# Patient Record
Sex: Male | Born: 1985 | Race: Black or African American | Hispanic: No | Marital: Single | Smoking: Light tobacco smoker
Health system: Southern US, Community
[De-identification: ages and names within clinical notes are randomized; demographics above are authoritative.]

---

## 2006-04-30 ENCOUNTER — Emergency Department (HOSPITAL_COMMUNITY): Admission: EM | Admit: 2006-04-30 | Discharge: 2006-04-30 | Payer: Self-pay | Admitting: Family Medicine

## 2008-07-11 IMAGING — CR DG ABDOMEN 1V
1 series · 1 of 1 positions shown · non-contrast
Comparison: none

CLINICAL DATA: Right flank pain, vomiting

ABDOMEN - 1 VIEW:

[view not recorded]
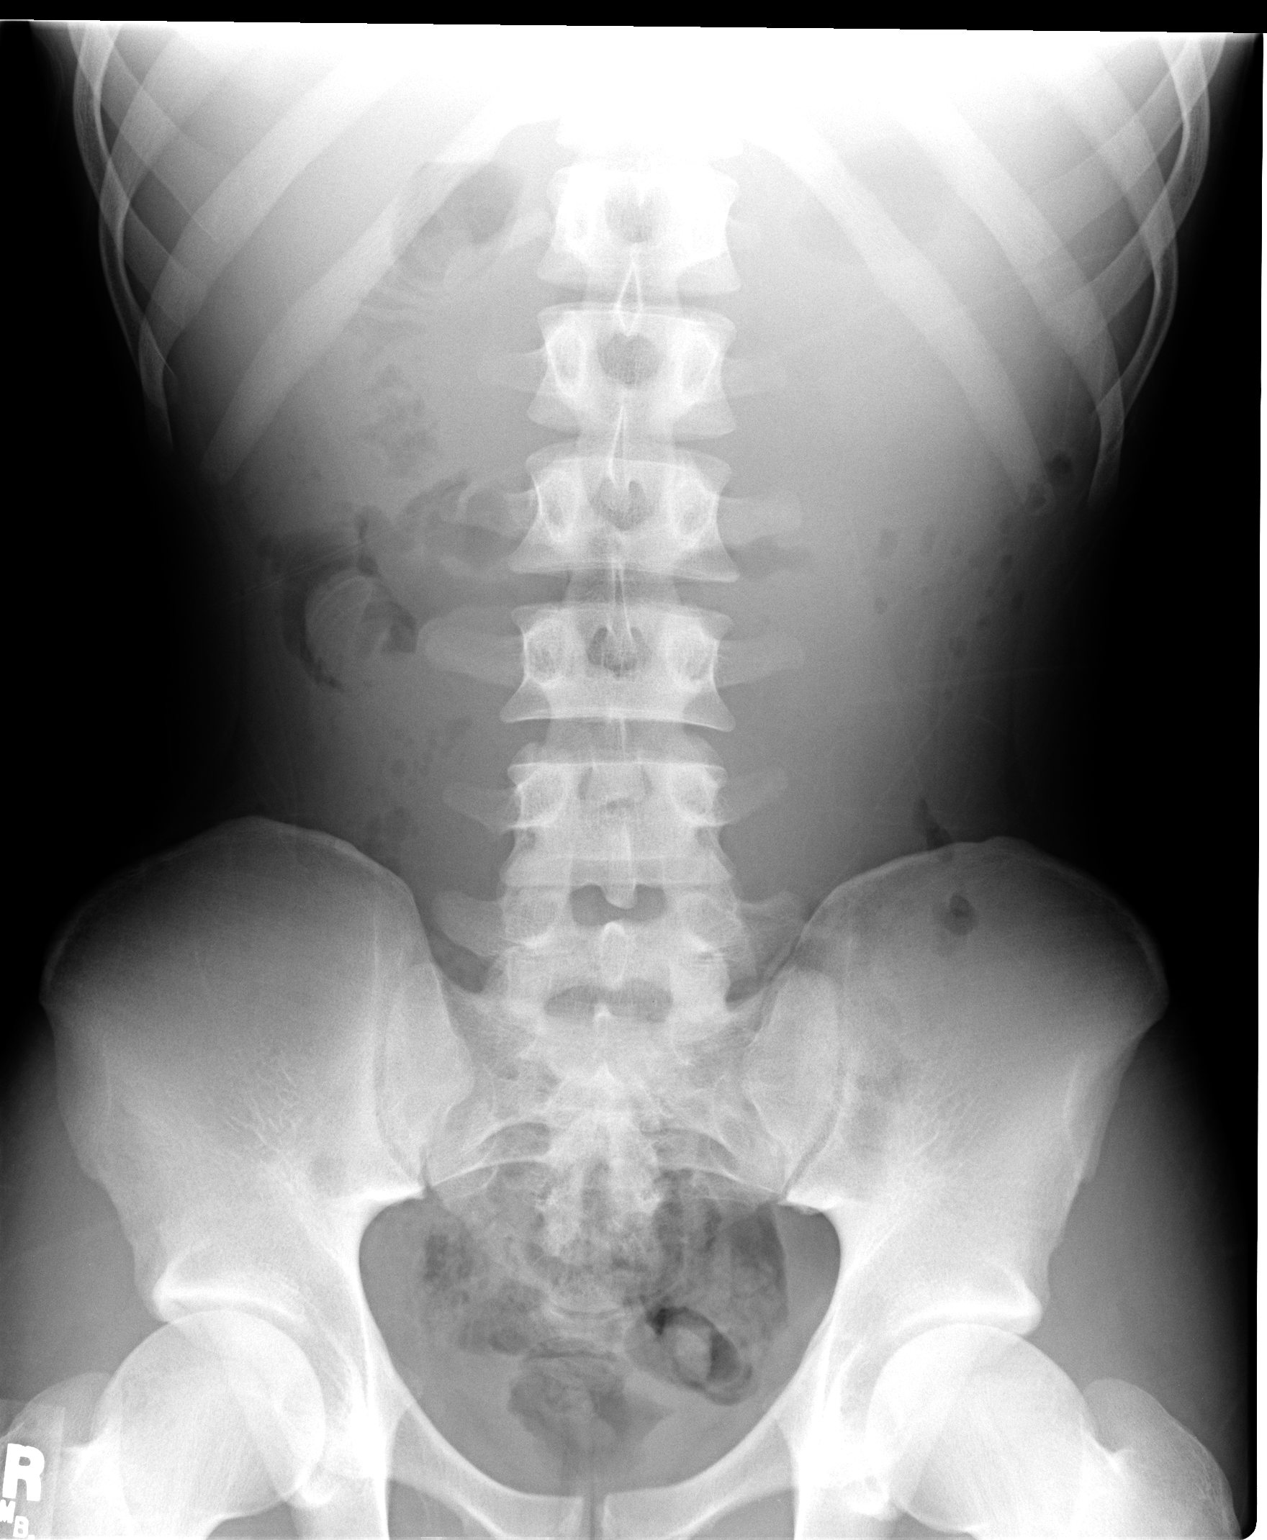

[1 of 1 positions shown; findings below may reference images not displayed]

FINDINGS: There is a mildly prominent single loop of small bowel within the
right midabdomen. There is an unusual appearance of this loop which may be
related to superimposition over colon, but I cannot completely exclude
intussusception given this appearance. If further evaluation is felt warranted,
this could be further evaluated with CT. The right psoas muscle margin is also
not well visualized.

No evidence of organomegaly or suspicious calcification. Visualized skeleton
unremarkable.
IMPRESSION: Mildly prominent small bowel loop in the right abdomen. Given the appearance
cannot completely exclude intussusception. This can be further evaluated with
CT.

## 2008-07-11 IMAGING — CT CT ABDOMEN W/ CM
2 of 5 series · 17 of 46 positions shown, 19 images · IV contrast (APPLIED)
Comparison: none

CLINICAL DATA: Right-sided abdomen pain, nausea and vomiting.  
 ABDOMEN CT WITH CONTRAST:
TECHNIQUE: Multidetector CT imaging of the abdomen was performed following the standard protocol during bolus administration of intravenous contrast.
 Contrast:  100 cc Omnipaque 300.
TECHNIQUE: Multidetector CT imaging of the pelvis was performed following the standard protocol during bolus administration of intravenous contrast.

[Series 2: abd/pelv with 5.0 b31f st · axial · 0.63mm/px · z∈[-496,-96]mm · 14 of 90 slices shown, 16 images]
[im 5/90  soft-tissue]
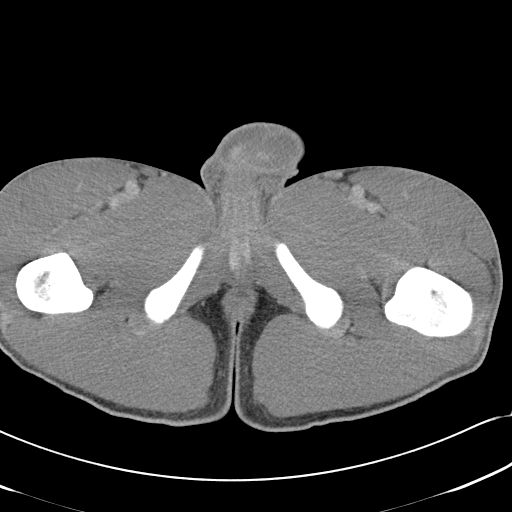
[im 5/90  bone]
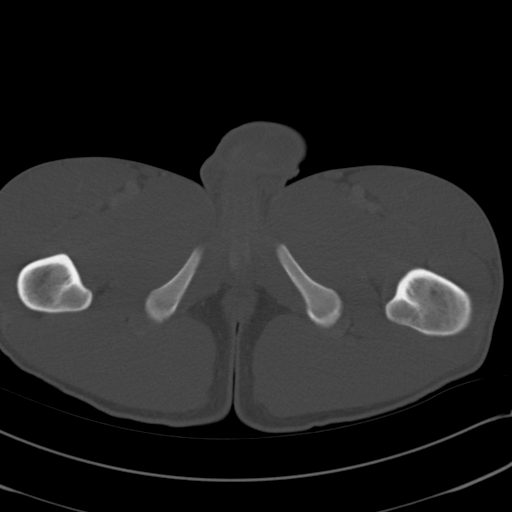
[im 10/90  soft-tissue]
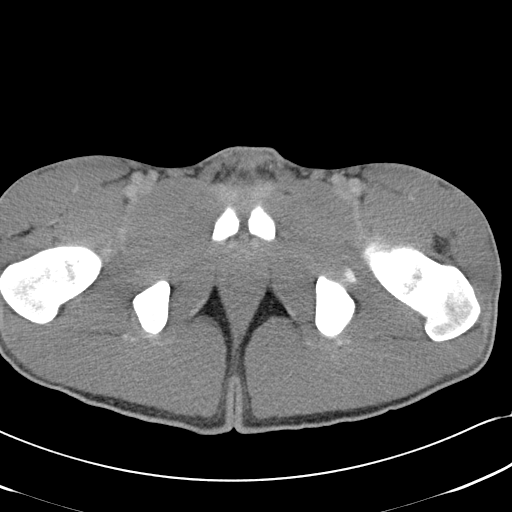
[im 19/90  soft-tissue]
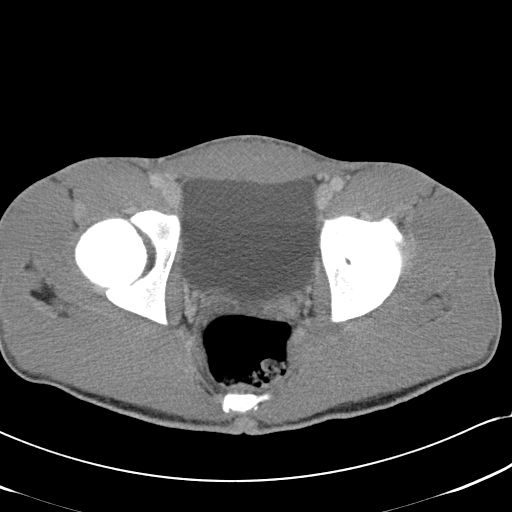
[im 24/90  soft-tissue]
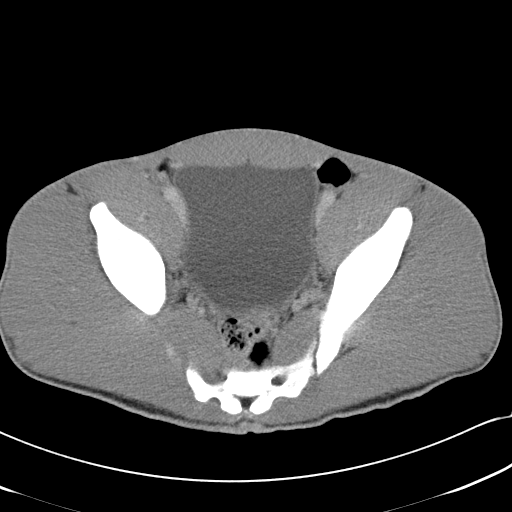
[im 29/90  soft-tissue]
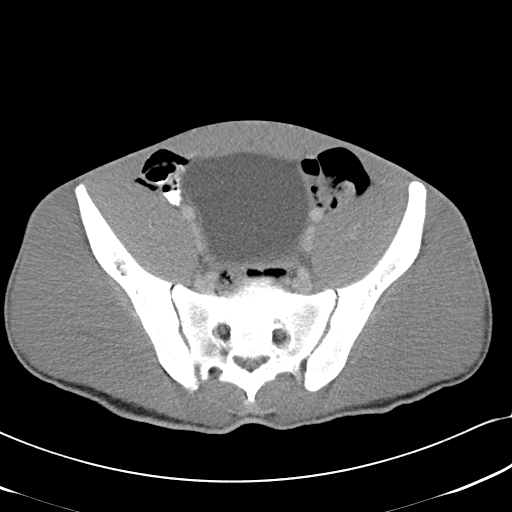
[im 38/90  soft-tissue]
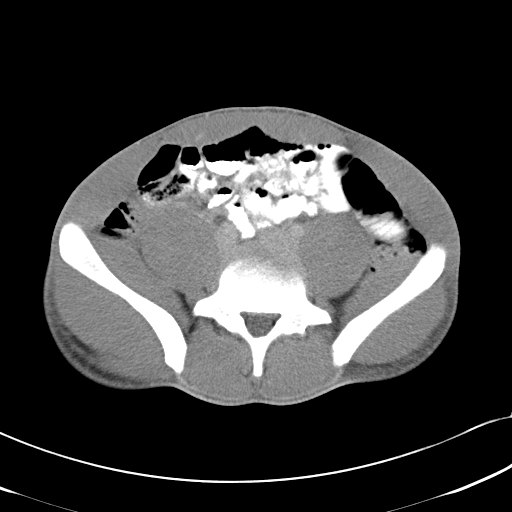
[im 43/90  soft-tissue]
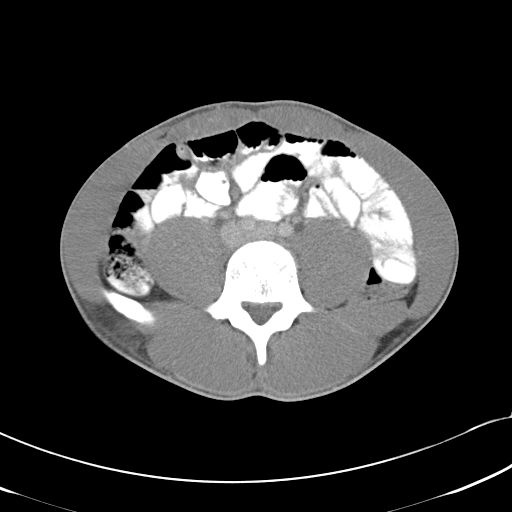
[im 47/90  soft-tissue]
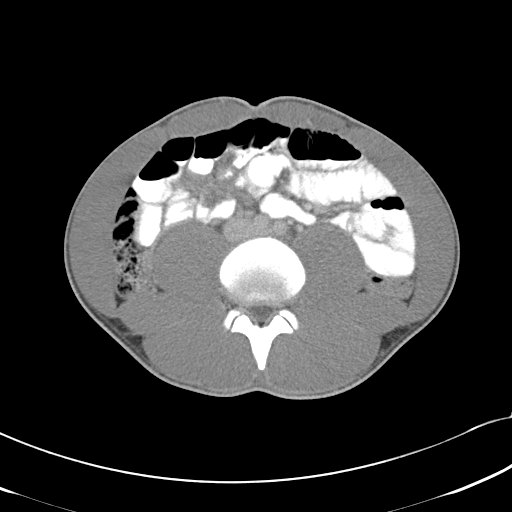
[im 52/90  soft-tissue]
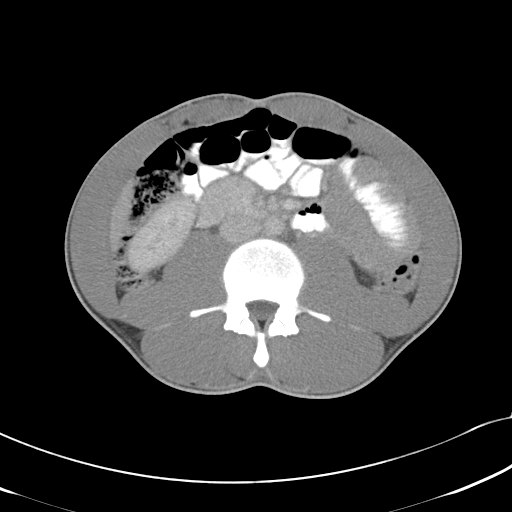
[im 52/90  bone]
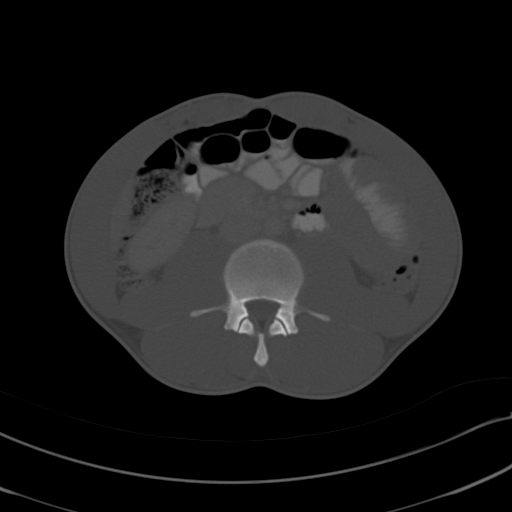
[im 61/90  soft-tissue]
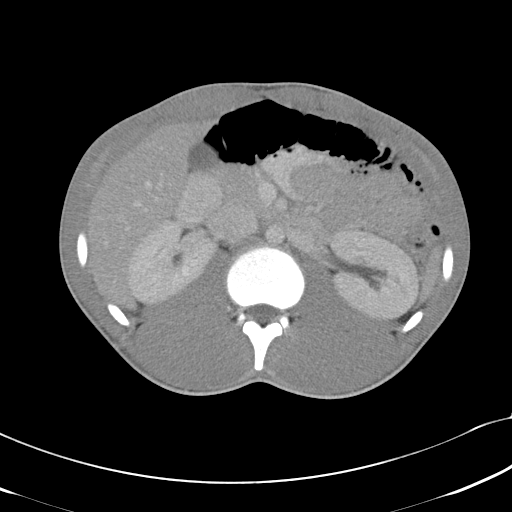
[im 66/90  soft-tissue]
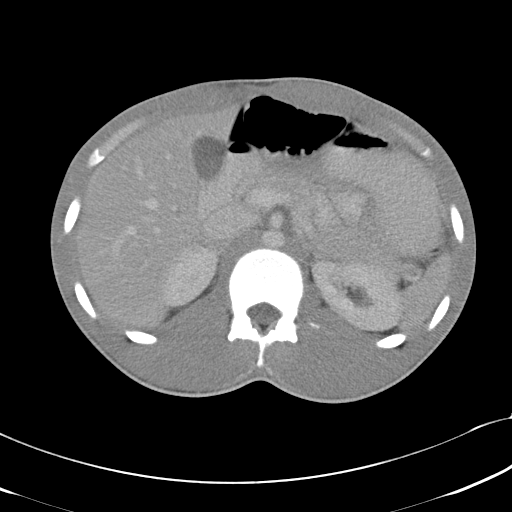
[im 71/90  soft-tissue]
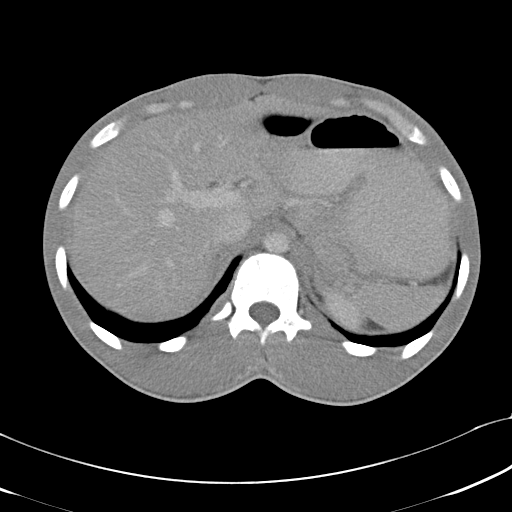
[im 80/90  soft-tissue]
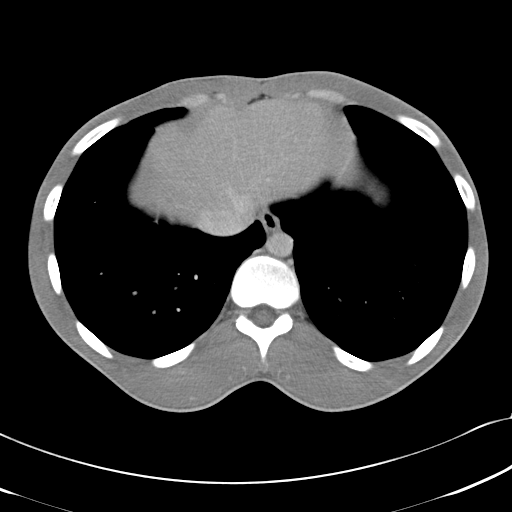
[im 85/90  soft-tissue]
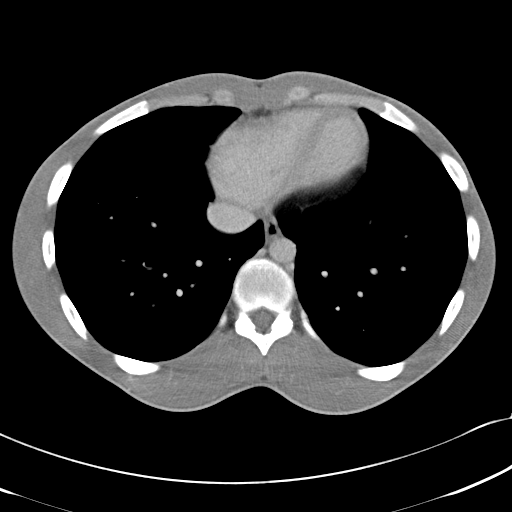

[Series 6: cor · coronal · 0.87mm/px · 3 of 83 slices shown]
[im 28/83  soft-tissue]
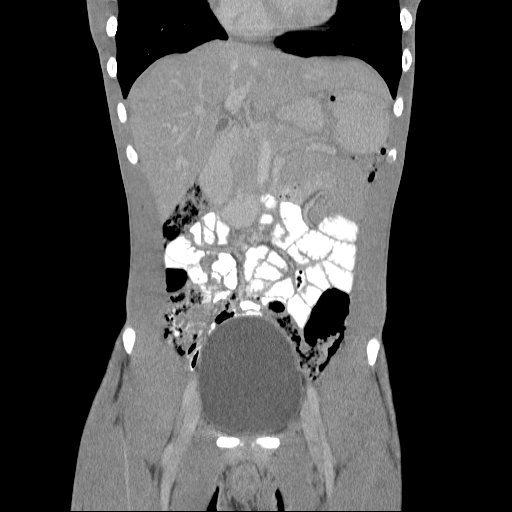
[im 37/83  soft-tissue]
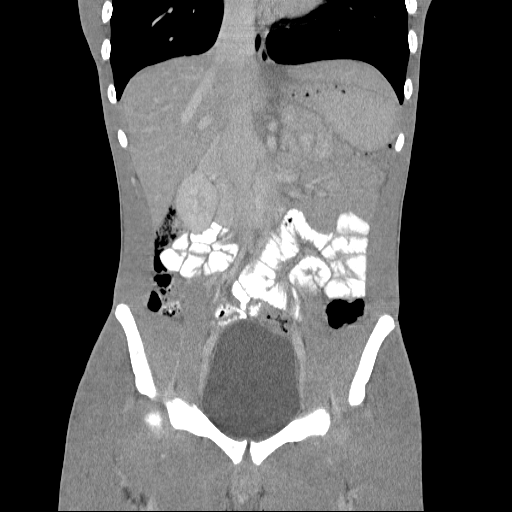
[im 46/83  soft-tissue]
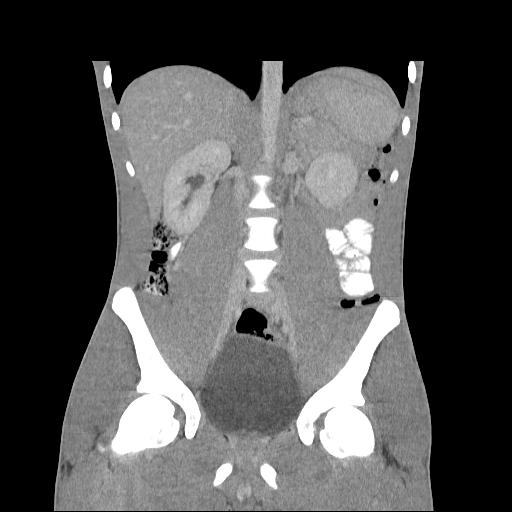

[17 of 46 positions shown; findings below may reference images not displayed]

FINDINGS: The lung bases are clear.  The liver appears normal with no focal abnormality.  No calcified gallstones are seen.  There is very little fat intraperitoneally but the pancreas is grossly normal as are the adrenal glands and the spleen.  The kidneys enhance normally and on delayed images, the pelvocaliceal systems appear normal.   The abdominal aorta is normal in caliber.  No abnormality of the bowel is seen as was questioned on prior plain film.
IMPRESSION: No acute abnormality on CT of the abdomen.  Very little peritoneal and retroperitoneal fat is present but no abnormality of the bowel is seen as questioned on plain film. 
 PELVIS CT WITH CONTRAST:
FINDINGS: Scans were continued through the pelvis after oral and IV contrast media were given. The urinary bladder was unremarkable.  No pelvic mass or fluid is seen.  The appendix is not seen with certainty.
IMPRESSION: Negative CT of the pelvis.

## 2016-04-13 ENCOUNTER — Ambulatory Visit (INDEPENDENT_AMBULATORY_CARE_PROVIDER_SITE_OTHER): Payer: Worker's Compensation | Admitting: Physician Assistant

## 2016-04-13 VITALS — BP 118/78 | HR 72 | Temp 98.3°F | Resp 16

## 2016-04-13 DIAGNOSIS — Z23 Encounter for immunization: Secondary | ICD-10-CM

## 2016-04-13 DIAGNOSIS — S61402A Unspecified open wound of left hand, initial encounter: Secondary | ICD-10-CM

## 2016-04-13 NOTE — Patient Instructions (Signed)
Wash the wounds at least daily with soap and water. Monitor for redness, swelling, drainage, increasing pain. If any of those develop, please return to the office for re-evaluation.

## 2016-04-13 NOTE — Progress Notes (Signed)
Henry FordDamien A Ayers [birth year]  Chief Complaint  Patient presents with  . Hand Injury    lac/abrasion left forefinger and thumb with handsaw today at 1100am   Presents for evaluation of work-related complaint.  Date of Injury: 13Nov2017, 11am  History of Present Illness: Patient presents for injury to left index finger and thumb which occurred while at work today. Patient's hand slipped while sawing a branch with a hand saw because the branch was slippery. States he was wearing gloves but the glove on his left hand was missing the index finger and thumb coverings. He states it bled immediately, to which he applied pressure and the bleeding has since stopped. States he checked the saw he was using and did not notice rust of it. Denies loss of sensation, loss of motor control, or numbness/tingling in his injured hand. Denies current pain at the site of injury. Denies nausea, vomiting, fever, chills, or other systemic symptoms. States he is out of date on his Tetanus vaccine.  ROS  Pertinent ROS mentioned above in HPI, otherwise negative.  Current medications and allergies reviewed and updated. Past medical history, family history, social history have been reviewed and updated.   Physical Exam  Constitutional: He is oriented to person, place, and time and well-developed, well-nourished, and in no distress. No distress.  HENT:  Head: Normocephalic and atraumatic.  Cardiovascular:  Pulses:      Radial pulses are 2+ on the right side, and 2+ on the left side.  Musculoskeletal:       Right hand: He exhibits normal range of motion, no tenderness, no deformity, no laceration and no swelling.       Left hand: He exhibits laceration. He exhibits normal range of motion, no tenderness, no bony tenderness, normal capillary refill, no deformity and no swelling. Normal sensation noted. Normal strength noted.       Hands: Neurological: He is alert and oriented to person, place, and time. GCS score is 15.    Skin: Skin is warm and dry. No rash noted. He is not diaphoretic. No erythema.  Psychiatric: Affect and judgment normal.  Blood pressure 118/78, pulse 72, temperature 98.3 F (36.8 C), temperature source Oral, resp. rate 16.  Assessment and Plan: 1. Open wound of left hand without foreign body, unspecified wound type, initial encounter Abrasion of left index finger and left thumb. Bleeding had ceased upon my examination. Site cleansed with soap and water in clinic and bandaid placed on site.   2. Need for prophylactic vaccination with combined diphtheria-tetanus-pertussis (DTP) vaccine - Tdap vaccine greater than or equal to 7yo IM

## 2016-04-13 NOTE — Progress Notes (Signed)
   Patient ID: Henry Ayers, male    DOB: 1986/03/06, 30 y.o.   MRN: [id number]  PCP: No PCP Per Patient  Chief Complaint  Patient presents with  . Hand Injury    lac/abrasion left forefinger and thumb with handsaw today at 1100am    Subjective:    HPI Presents for evaluation of injury to the LEFT hand.  He was using a hand saw on a branch today when it slipped. His gloves were missing the index finger and thumb, and the exposed skin was scraped. He immediately had bleeding and applied pressure.  No loss of sensation, strength.  RIGHT hand dominant.  Tetanus is not current.  Review of Systems As above.    Patient Active Problem List   Diagnosis Date Noted  . Need for prophylactic vaccination with combined diphtheria-tetanus-pertussis (DTP) vaccine 04/13/2016     Prior to Admission medications   Medication Sig Start Date End Date Taking? Authorizing Provider  acetaminophen-codeine (TYLENOL #3) 300-30 MG tablet Take 1 tablet by mouth every 4 (four) hours as needed for moderate pain.   Yes Historical Provider, MD  ibuprofen (ADVIL,MOTRIN) 200 MG tablet Take 200 mg by mouth every 4 (four) hours as needed.   Yes Historical Provider, MD     No Known Allergies     Objective:  Physical Exam  Constitutional: He is oriented to person, place, and time. He appears well-developed and well-nourished. He is active and cooperative. No distress.  BP 118/78 (Cuff Size: Normal)   Pulse 72   Temp 98.3 F (36.8 C) (Oral)   Resp 16    Eyes: Conjunctivae are normal.  Pulmonary/Chest: Effort normal.  Musculoskeletal:       Left hand: He exhibits laceration (superficial lacerations to the index finger and thumb, consistent with described injury. No debris noted in the wounds, which are clean). He exhibits normal range of motion, no tenderness, no bony tenderness, normal two-point discrimination, normal capillary refill, no deformity and no swelling. Normal sensation noted. Normal  strength noted.  Neurological: He is alert and oriented to person, place, and time.  Psychiatric: He has a normal mood and affect. His speech is normal and behavior is normal.           Assessment & Plan:   1. Open wound of left hand without foreign body, unspecified wound type, initial encounter Superficial wounds. Local wound care. Covered with bandaids. Anticipatory guidance provided.  2. Need for prophylactic vaccination with combined diphtheria-tetanus-pertussis (DTP) vaccine - Tdap vaccine greater than or equal to 7yo IM   Fernande Brashelle S. Aliya Sol, PA-C Physician Assistant-Certified [facility]
